# Patient Record
Sex: Female | Born: 1990
Health system: Southern US, Community
[De-identification: ages and names within clinical notes are randomized; demographics above are authoritative.]

## PROBLEM LIST (undated history)

## (undated) DIAGNOSIS — E282 Polycystic ovarian syndrome: Secondary | ICD-10-CM

## (undated) HISTORY — PX: TUBAL LIGATION: SHX77

---

## 2014-07-15 DIAGNOSIS — Z23 Encounter for immunization: Secondary | ICD-10-CM | POA: Insufficient documentation

## 2014-07-15 DIAGNOSIS — E282 Polycystic ovarian syndrome: Secondary | ICD-10-CM | POA: Insufficient documentation

## 2016-01-04 ENCOUNTER — Emergency Department: Payer: Medicaid Other

## 2016-01-04 ENCOUNTER — Emergency Department
Admission: EM | Admit: 2016-01-04 | Discharge: 2016-01-05 | Disposition: A | Discharge status: Home / Self Care | Payer: Medicaid Other | Attending: Emergency Medicine | Admitting: Emergency Medicine

## 2016-01-04 ENCOUNTER — Encounter: Payer: Self-pay | Admitting: *Deleted

## 2016-01-04 DIAGNOSIS — R109 Unspecified abdominal pain: Secondary | ICD-10-CM

## 2016-01-04 DIAGNOSIS — R1030 Lower abdominal pain, unspecified: Secondary | ICD-10-CM | POA: Insufficient documentation

## 2016-01-04 DIAGNOSIS — R110 Nausea: Secondary | ICD-10-CM | POA: Diagnosis not present

## 2016-01-04 DIAGNOSIS — O9989 Other specified diseases and conditions complicating pregnancy, childbirth and the puerperium: Secondary | ICD-10-CM | POA: Diagnosis present

## 2016-01-04 DIAGNOSIS — Z3A01 Less than 8 weeks gestation of pregnancy: Secondary | ICD-10-CM | POA: Diagnosis not present

## 2016-01-04 DIAGNOSIS — R102 Pelvic and perineal pain: Secondary | ICD-10-CM | POA: Diagnosis not present

## 2016-01-04 DIAGNOSIS — O26899 Other specified pregnancy related conditions, unspecified trimester: Secondary | ICD-10-CM

## 2016-01-04 DIAGNOSIS — Z3490 Encounter for supervision of normal pregnancy, unspecified, unspecified trimester: Secondary | ICD-10-CM

## 2016-01-04 DIAGNOSIS — O99211 Obesity complicating pregnancy, first trimester: Secondary | ICD-10-CM | POA: Diagnosis not present

## 2016-01-04 HISTORY — DX: Polycystic ovarian syndrome: E28.2

## 2016-01-04 HISTORY — DX: Morbid (severe) obesity due to excess calories: E66.01

## 2016-01-04 LAB — COMPREHENSIVE METABOLIC PANEL
ALK PHOS: 59 U/L (ref 38–126)
ALT: 11 U/L — AB (ref 14–54)
AST: 20 U/L (ref 15–41)
Albumin: 3.8 g/dL (ref 3.5–5.0)
Anion gap: 6 (ref 5–15)
BUN: 9 mg/dL (ref 6–20)
CHLORIDE: 105 mmol/L (ref 101–111)
CO2: 24 mmol/L (ref 22–32)
CREATININE: 0.64 mg/dL (ref 0.44–1.00)
Calcium: 8.8 mg/dL — ABNORMAL LOW (ref 8.9–10.3)
GFR calc Af Amer: >60 mL/min (ref >60)
GFR calc non Af Amer: >60 mL/min (ref >60)
GLUCOSE: 95 mg/dL (ref 65–99)
Potassium: 3.4 mmol/L — ABNORMAL LOW (ref 3.5–5.1)
SODIUM: 135 mmol/L (ref 135–145)
Total Bilirubin: <0.1 mg/dL — ABNORMAL LOW (ref 0.3–1.2)
Total Protein: 7.2 g/dL (ref 6.5–8.1)

## 2016-01-04 LAB — CBC
HCT: 34.3 % — ABNORMAL LOW (ref 35.0–47.0)
Hemoglobin: 10.6 g/dL — ABNORMAL LOW (ref 12.0–16.0)
MCH: 20.3 pg — AB (ref 26.0–34.0)
MCHC: 31 g/dL — AB (ref 32.0–36.0)
MCV: 65.5 fL — AB (ref 80.0–100.0)
PLATELETS: 236 10*3/uL (ref 150–440)
RBC: 5.23 MIL/uL — ABNORMAL HIGH (ref 3.80–5.20)
RDW: 17.2 % — AB (ref 11.5–14.5)
WBC: 11.2 10*3/uL — ABNORMAL HIGH (ref 3.6–11.0)

## 2016-01-04 LAB — URINALYSIS COMPLETE WITH MICROSCOPIC (ARMC ONLY)
BILIRUBIN URINE: NEGATIVE
Bacteria, UA: NONE SEEN
GLUCOSE, UA: NEGATIVE mg/dL
Hgb urine dipstick: NEGATIVE
Ketones, ur: NEGATIVE mg/dL
Leukocytes, UA: NEGATIVE
Nitrite: NEGATIVE
PH: 5 (ref 5.0–8.0)
Protein, ur: NEGATIVE mg/dL
Specific Gravity, Urine: 1.019 (ref 1.005–1.030)

## 2016-01-04 LAB — HCG, QUANTITATIVE, PREGNANCY: hCG, Beta Chain, Quant, S: 27544 m[IU]/mL — ABNORMAL HIGH (ref <5)

## 2016-01-04 NOTE — ED Provider Notes (Signed)
[facility] Emergency Department Provider Note  ____________________________________________  Time seen: Approximately 11:35 PM  I have reviewed the triage vital signs and the nursing notes.   HISTORY  Chief Complaint Abdominal Pain    HPI Angela Hinton is a 25 y.o. female G2P1 with a history of PCOS and morbid obesity who presentswith a recent diagnosis of pregnancy and complains of sharp pelvic pains.  She states that she did not know she is pregnant and typically has irregular periods.  Her last menstrual period was approximately 2 months ago but that is not unusual for her.  She went to her primary care doctor for her annual visit including Pap smear and a urine pregnancy test revealed that she was pregnant.  She does not know her dates but it is most likely less than 8 weeks given that her last period was about 2 months ago.  She reports that she has been having mild intermittent lower abdominal cramping for about a week but did not think much of it.  However after she learned she was pregnant she became aware of intermittent moderate sharp lower abdominal pains that are brief but concerning to her.  She occasionally has nausea but no vomiting.  She denies dysuria, vaginal bleeding and discharge, fever/chills, chest pain, shortness of breath, and any other abdominal pain.  Nothing makes the sharp pains better and nothing makes it worse.  They are acute in onset  Patient has primary care doctor in [place] located about an hour away but does not have a local provider yet.  Past Medical History  Diagnosis Date  . PCOS (polycystic ovarian syndrome)   . Morbid obesity (HCC)     There are no active problems to display for this patient.   History reviewed. No pertinent past surgical history.  No current outpatient prescriptions on file.  Allergies Morphine and related and Ivp dye  History reviewed. No pertinent family history.  Social  History Social History  Substance Use Topics  . Smoking status: Never Smoker   . Smokeless tobacco: None  . Alcohol Use: No    Review of Systems Constitutional: No fever/chills Eyes: No visual changes. ENT: No sore throat. Cardiovascular: Denies chest pain. Respiratory: Denies shortness of breath. Gastrointestinal: Intermittent lower abdominal sharp stabbing pains that are brief and accompanied with nausea but no vomiting. Genitourinary: Negative for dysuria.  No vaginal bleeding or discharge Musculoskeletal: Negative for back pain. Skin: Negative for rash. Neurological: Negative for headaches, focal weakness or numbness.  10-point ROS otherwise negative.  ____________________________________________   PHYSICAL EXAM:  VITAL SIGNS: ED Triage Vitals  Enc Vitals Group     BP 01/04/16 2036 143/73 mmHg     Pulse Rate 01/04/16 2036 91     Resp 01/04/16 2036 20     Temp 01/04/16 2036 98.8 F (37.1 C)     Temp Source 01/04/16 2036 Oral     SpO2 01/04/16 2036 100 %     Weight 01/04/16 2036 275 lb (124.739 kg)     Height 01/04/16 2036 5\' 2"  (1.575 m)     Head Cir --      Peak Flow --      Pain Score 01/04/16 2039 6     Pain Loc --      Pain Edu? --      Excl. in GC? --     Constitutional: Alert and oriented. Well appearing and in no acute distress. Eyes: Conjunctivae are normal. PERRL. EOMI. Head: Atraumatic.  Nose: No congestion/rhinnorhea. Mouth/Throat: Mucous membranes are moist.  Oropharynx non-erythematous. Neck: No stridor.   Cardiovascular: Normal rate, regular rhythm. Grossly normal heart sounds.  Good peripheral circulation. Respiratory: Normal respiratory effort.  No retractions. Lungs CTAB. Gastrointestinal: Morbid obesity.  Soft and nontender. No distention. No abdominal bruits. No CVA tenderness. Genitourinary: Deferred given no vaginal bleeding and pelvic exam/Pap smear performed 2 days ago by PCP Musculoskeletal: No lower extremity tenderness nor edema.   No joint effusions. Neurologic:  Normal speech and language. No gross focal neurologic deficits are appreciated.  Skin:  Skin is warm, dry and intact. No rash noted. Psychiatric: Mood and affect are normal. Speech and behavior are normal.  ____________________________________________   LABS (all labs ordered are listed, but only abnormal results are displayed)  Labs Reviewed  COMPREHENSIVE METABOLIC PANEL - Abnormal; Notable for the following:    Potassium 3.4 (*)    Calcium 8.8 (*)    ALT 11 (*)    Total Bilirubin <0.1 (*)    All other components within normal limits  CBC - Abnormal; Notable for the following:    WBC 11.2 (*)    RBC 5.23 (*)    Hemoglobin 10.6 (*)    HCT 34.3 (*)    MCV 65.5 (*)    MCH 20.3 (*)    MCHC 31.0 (*)    RDW 17.2 (*)    All other components within normal limits  URINALYSIS COMPLETEWITH MICROSCOPIC ([facility] ONLY) - Abnormal; Notable for the following:    Color, Urine YELLOW (*)    APPearance CLEAR (*)    Squamous Epithelial / LPF 0-5 (*)    All other components within normal limits  HCG, QUANTITATIVE, PREGNANCY - Abnormal; Notable for the following:    hCG, Beta Chain, Quant, S 96045 (*)    All other components within normal limits   ____________________________________________  EKG  None ____________________________________________  RADIOLOGY   US Ob Comp Less 14 Wks  01/05/2016  CLINICAL DATA:  Pregnant patient with pelvic pain in early pregnancy. EXAM: OBSTETRIC <14 WK [place] AND TRANSVAGINAL OB US TECHNIQUE: Both transabdominal and transvaginal ultrasound examinations were performed for complete evaluation of the gestation as well as the maternal uterus, adnexal regions, and pelvic cul-de-sac. Transvaginal technique was performed to assess early pregnancy. COMPARISON:  None. FINDINGS: Intrauterine gestational sac: Visualized/normal in shape. Yolk sac:  Present. Embryo:  Present. Cardiac Activity: Present. Heart Rate: 123  bpm CRL:  5.6  mm   6 w    2 d                  [place] EDC: 08/28/2016 Subchorionic hemorrhage:  None visualized. Maternal uterus/adnexae: Both ovaries are well visualized and normal. No pelvic free fluid. IMPRESSION: Single live intrauterine pregnancy estimated gestational age [redacted] weeks 2 days for estimated date of delivery 08/28/2016. No complication. Electronically Signed   By: Rubye Oaks M.D.   On: 01/05/2016 01:09   US Ob Transvaginal  01/05/2016  CLINICAL DATA:  Pregnant patient with pelvic pain in early pregnancy. EXAM: OBSTETRIC <14 WK [place] AND TRANSVAGINAL OB US TECHNIQUE: Both transabdominal and transvaginal ultrasound examinations were performed for complete evaluation of the gestation as well as the maternal uterus, adnexal regions, and pelvic cul-de-sac. Transvaginal technique was performed to assess early pregnancy. COMPARISON:  None. FINDINGS: Intrauterine gestational sac: Visualized/normal in shape. Yolk sac:  Present. Embryo:  Present. Cardiac Activity: Present. Heart Rate: 123  bpm CRL:  5.6  mm   6 w  2 d                  [place] EDC: 08/28/2016 Subchorionic hemorrhage:  None visualized. Maternal uterus/adnexae: Both ovaries are well visualized and normal. No pelvic free fluid. IMPRESSION: Single live intrauterine pregnancy estimated gestational age [redacted] weeks 2 days for estimated date of delivery 08/28/2016. No complication. Electronically Signed   By: Rubye Oaks M.D.   On: 01/05/2016 01:09    ____________________________________________   PROCEDURES  Procedure(s) performed: None  Critical Care performed: No ____________________________________________   INITIAL IMPRESSION / ASSESSMENT AND PLAN / ED COURSE  Pertinent labs & imaging results that were available during my care of the patient were reviewed by me and considered in my medical decision making (see chart for details).  The patient's labs including urinalysis are unremarkable and her vital signs are normal and stable.  I suspect that she is more  aware of normal early pregnancy discomfort now that she knows she is pregnant and that she does not have an emergent medical condition at this time.  However her body habitus would make it difficult to adequately assess the pregnancy with a bedside ultrasound.  I will obtain a early pregnancy transvaginal ultrasound to assess the pregnancy and ensure and intrauterine pregnancy and attempt to assess dates.  The patient agrees with the plan at this time.  ----------------------------------------- 1:39 AM on 01/05/2016 -----------------------------------------  Reassuring ultrasound workup.  Patient sleeping comfortably with stable vitals.  I gave the results of the ultrasound.  I gave my usual and customary return precautions.  No indication of emergent medical condition at this time.  ____________________________________________  FINAL CLINICAL IMPRESSION(S) / ED DIAGNOSES  Final diagnoses:  Early stage of pregnancy  Abdominal pain during pregnancy      NEW MEDICATIONS STARTED DURING THIS VISIT:  New Prescriptions   No medications on file     Loleta Rose, MD 01/05/16 0139

## 2016-01-04 NOTE — ED Notes (Signed)
Pt has low abd pain.  No vag bleeding.  No dysuria.   Pt has lower back pain.  Pt is pregnant.

## 2016-01-05 NOTE — Discharge Instructions (Signed)
You were seen today for abdominal/pelvic pain and the early stages of pregnancy.  Your workup was reassuring today and your ultrasound showed a single pregnancy at 6 weeks and 2 days gestation.  Please continue taking your prenatal vitamins and follow up with your prenatal provider.  Return to the emergency department with new or worsening symptoms that concern you.   First Trimester of Pregnancy The first trimester of pregnancy is from week 1 until the end of week 12 (months 1 through 3). A week after a sperm fertilizes an egg, the egg will implant on the wall of the uterus. This embryo will begin to develop into a baby. Genes from you and your partner are forming the baby. The female genes determine whether the baby is a boy or a girl. At 6-8 weeks, the eyes and face are formed, and the heartbeat can be seen on ultrasound. At the end of 12 weeks, all the baby's organs are formed.  Now that you are pregnant, you will want to do everything you can to have a healthy baby. Two of the most important things are to get good prenatal care and to follow your health care provider's instructions. Prenatal care is all the medical care you receive before the baby's birth. This care will help prevent, find, and treat any problems during the pregnancy and childbirth. BODY CHANGES Your body goes through many changes during pregnancy. The changes vary from woman to woman.   You may gain or lose a couple of pounds at first.  You may feel sick to your stomach (nauseous) and throw up (vomit). If the vomiting is uncontrollable, call your health care provider.  You may tire easily.  You may develop headaches that can be relieved by medicines approved by your health care provider.  You may urinate more often. Painful urination may mean you have a bladder infection.  You may develop heartburn as a result of your pregnancy.  You may develop constipation because certain hormones are causing the muscles that push waste  through your intestines to slow down.  You may develop hemorrhoids or swollen, bulging veins (varicose veins).  Your breasts may begin to grow larger and become tender. Your nipples may stick out more, and the tissue that surrounds them (areola) may become darker.  Your gums may bleed and may be sensitive to brushing and flossing.  Dark spots or blotches (chloasma, mask of pregnancy) may develop on your face. This will likely fade after the baby is born.  Your menstrual periods will stop.  You may have a loss of appetite.  You may develop cravings for certain kinds of food.  You may have changes in your emotions from day to day, such as being excited to be pregnant or being concerned that something may go wrong with the pregnancy and baby.  You may have more vivid and strange dreams.  You may have changes in your hair. These can include thickening of your hair, rapid growth, and changes in texture. Some women also have hair loss during or after pregnancy, or hair that feels dry or thin. Your hair will most likely return to normal after your baby is born. WHAT TO EXPECT AT YOUR PRENATAL VISITS During a routine prenatal visit:  You will be weighed to make sure you and the baby are growing normally.  Your blood pressure will be taken.  Your abdomen will be measured to track your baby's growth.  The fetal heartbeat will be listened to starting around  week 10 or 12 of your pregnancy.  Test results from any previous visits will be discussed. Your health care provider may ask you:  How you are feeling.  If you are feeling the baby move.  If you have had any abnormal symptoms, such as leaking fluid, bleeding, severe headaches, or abdominal cramping.  If you are using any tobacco products, including cigarettes, chewing tobacco, and electronic cigarettes.  If you have any questions. Other tests that may be performed during your first trimester include:  Blood tests to find your  blood type and to check for the presence of any previous infections. They will also be used to check for low iron levels (anemia) and Rh antibodies. Later in the pregnancy, blood tests for diabetes will be done along with other tests if problems develop.  Urine tests to check for infections, diabetes, or protein in the urine.  An ultrasound to confirm the proper growth and development of the baby.  An amniocentesis to check for possible genetic problems.  Fetal screens for spina bifida and Down syndrome.  You may need other tests to make sure you and the baby are doing well.  HIV (human immunodeficiency virus) testing. Routine prenatal testing includes screening for HIV, unless you choose not to have this test. HOME CARE INSTRUCTIONS  Medicines  Follow your health care provider's instructions regarding medicine use. Specific medicines may be either safe or unsafe to take during pregnancy.  Take your prenatal vitamins as directed.  If you develop constipation, try taking a stool softener if your health care provider approves. Diet  Eat regular, well-balanced meals. Choose a variety of foods, such as meat or vegetable-based protein, fish, milk and low-fat dairy products, vegetables, fruits, and whole grain breads and cereals. Your health care provider will help you determine the amount of weight gain that is right for you.  Avoid raw meat and uncooked cheese. These carry germs that can cause birth defects in the baby.  Eating four or five small meals rather than three large meals a day may help relieve nausea and vomiting. If you start to feel nauseous, eating a few soda crackers can be helpful. Drinking liquids between meals instead of during meals also seems to help nausea and vomiting.  If you develop constipation, eat more high-fiber foods, such as fresh vegetables or fruit and whole grains. Drink enough fluids to keep your urine clear or pale yellow. Activity and Exercise  Exercise  only as directed by your health care provider. Exercising will help you:  Control your weight.  Stay in shape.  Be prepared for labor and delivery.  Experiencing pain or cramping in the lower abdomen or low back is a good sign that you should stop exercising. Check with your health care provider before continuing normal exercises.  Try to avoid standing for long periods of time. Move your legs often if you must stand in one place for a long time.  Avoid heavy lifting.  Wear low-heeled shoes, and practice good posture.  You may continue to have sex unless your health care provider directs you otherwise. Relief of Pain or Discomfort  Wear a good support bra for breast tenderness.   Take warm sitz baths to soothe any pain or discomfort caused by hemorrhoids. Use hemorrhoid cream if your health care provider approves.   Rest with your legs elevated if you have leg cramps or low back pain.  If you develop varicose veins in your legs, wear support hose. Elevate your feet  for 15 minutes, 3-4 times a day. Limit salt in your diet. Prenatal Care  Schedule your prenatal visits by the twelfth week of pregnancy. They are usually scheduled monthly at first, then more often in the last 2 months before delivery.  Write down your questions. Take them to your prenatal visits.  Keep all your prenatal visits as directed by your health care provider. Safety  Wear your seat belt at all times when driving.  Make a list of emergency phone numbers, including numbers for family, friends, the hospital, and police and fire departments. General Tips  Ask your health care provider for a referral to a local prenatal education class. Begin classes no later than at the beginning of month 6 of your pregnancy.  Ask for help if you have counseling or nutritional needs during pregnancy. Your health care provider can offer advice or refer you to specialists for help with various needs.  Do not use hot tubs,  steam rooms, or saunas.  Do not douche or use tampons or scented sanitary pads.  Do not cross your legs for long periods of time.  Avoid cat litter boxes and soil used by cats. These carry germs that can cause birth defects in the baby and possibly loss of the fetus by miscarriage or stillbirth.  Avoid all smoking, herbs, alcohol, and medicines not prescribed by your health care provider. Chemicals in these affect the formation and growth of the baby.  Do not use any tobacco products, including cigarettes, chewing tobacco, and electronic cigarettes. If you need help quitting, ask your health care provider. You may receive counseling support and other resources to help you quit.  Schedule a dentist appointment. At home, brush your teeth with a soft toothbrush and be gentle when you floss. SEEK MEDICAL CARE IF:   You have dizziness.  You have mild pelvic cramps, pelvic pressure, or nagging pain in the abdominal area.  You have persistent nausea, vomiting, or diarrhea.  You have a bad smelling vaginal discharge.  You have pain with urination.  You notice increased swelling in your face, hands, legs, or ankles. SEEK IMMEDIATE MEDICAL CARE IF:   You have a fever.  You are leaking fluid from your vagina.  You have spotting or bleeding from your vagina.  You have severe abdominal cramping or pain.  You have rapid weight gain or loss.  You vomit blood or material that looks like coffee grounds.  You are exposed to [place] measles and have never had them.  You are exposed to fifth disease or chickenpox.  You develop a severe headache.  You have shortness of breath.  You have any kind of trauma, such as from a fall or a car accident.   This information is not intended to replace advice given to you by your health care provider. Make sure you discuss any questions you have with your health care provider.   Document Released: 11/12/2001 Document Revised: 12/09/2014 Document  Reviewed: 09/28/2013 Elsevier Interactive Patient Education 2016 [employer].  Prenatal Care WHAT IS PRENATAL CARE?  Prenatal care is the process of caring for a pregnant woman before she gives birth. Prenatal care makes sure that she and her baby remain as healthy as possible throughout pregnancy. Prenatal care may be provided by a midwife, family practice health care provider, or a childbirth and pregnancy specialist (obstetrician). Prenatal care may include physical examinations, testing, treatments, and education on nutrition, lifestyle, and social support services. WHY IS PRENATAL CARE SO IMPORTANT?  Early  and consistent prenatal care increases the chance that you and your baby will remain healthy throughout your pregnancy. This type of care also decreases a baby's risk of being born too early (prematurely), or being born smaller than expected (small for gestational age). Any underlying medical conditions you may have that could pose a risk during your pregnancy are discussed during prenatal care visits. You will also be monitored regularly for any new conditions that may arise during your pregnancy so they can be treated quickly and effectively. WHAT HAPPENS DURING PRENATAL CARE VISITS? Prenatal care visits may include the following: Discussion Tell your health care provider about any new signs or symptoms you have experienced since your last visit. These might include:  Nausea or vomiting.  Increased or decreased level of energy.  Difficulty sleeping.  Back or leg pain.  Weight changes.  Frequent urination.  Shortness of breath with physical activity.  Changes in your skin, such as the development of a rash or itchiness.  Vaginal discharge or bleeding.  Feelings of excitement or nervousness.  Changes in your baby's movements. You may want to write down any questions or topics you want to discuss with your health care provider and bring them with you to your  appointment. Examination During your first prenatal care visit, you will likely have a complete physical exam. Your health care provider will often examine your vagina, cervix, and the position of your uterus, as well as check your heart, lungs, and other body systems. As your pregnancy progresses, your health care provider will measure the size of your uterus and your baby's position inside your uterus. He or she may also examine you for early signs of labor. Your prenatal visits may also include checking your blood pressure and, after about 10-12 weeks of pregnancy, listening to your baby's heartbeat. Testing Regular testing often includes:  Urinalysis. This checks your urine for glucose, protein, or signs of infection.  Blood count. This checks the levels of white and red blood cells in your body.  Tests for sexually transmitted infections (STIs). Testing for STIs at the beginning of pregnancy is routinely done and is required in many states.  Antibody testing. You will be checked to see if you are immune to certain illnesses, such as rubella, that can affect a developing fetus.  Glucose screen. Around 24-28 weeks of pregnancy, your blood glucose level will be checked for signs of gestational diabetes. Follow-up tests may be recommended.  Group B strep. This is a bacteria that is commonly found inside a woman's vagina. This test will inform your health care provider if you need an antibiotic to reduce the amount of this bacteria in your body prior to labor and childbirth.  Ultrasound. Many pregnant women undergo an ultrasound screening around 18-20 weeks of pregnancy to evaluate the health of the fetus and check for any developmental abnormalities.  HIV (human immunodeficiency virus) testing. Early in your pregnancy, you will be screened for HIV. If you are at high risk for HIV, this test may be repeated during your third trimester of pregnancy. You may be offered other testing based on your  age, personal or family medical history, or other factors.  HOW OFTEN SHOULD I PLAN TO SEE MY HEALTH CARE PROVIDER FOR PRENATAL CARE? Your prenatal care check-up schedule depends on any medical conditions you have before, or develop during, your pregnancy. If you do not have any underlying medical conditions, you will likely be seen for checkups:  Monthly, during the first 6  months of pregnancy.  Twice a month during months 7 and 8 of pregnancy.  Weekly starting in the 9th month of pregnancy and until delivery. If you develop signs of early labor or other concerning signs or symptoms, you may need to see your health care provider more often. Ask your health care provider what prenatal care schedule is best for you. WHAT CAN I DO TO KEEP MYSELF AND MY BABY AS HEALTHY AS POSSIBLE DURING MY PREGNANCY?  Take a prenatal vitamin containing 400 micrograms (0.4 mg) of folic acid every day. Your health care provider may also ask you to take additional vitamins such as iodine, vitamin D, iron, copper, and zinc.  Take 1500-2000 mg of calcium daily starting at your 20th week of pregnancy until you deliver your baby.  Make sure you are up to date on your vaccinations. Unless directed otherwise by your health care provider:  You should receive a tetanus, diphtheria, and pertussis (Tdap) vaccination between the 27th and 36th week of your pregnancy, regardless of when your last Tdap immunization occurred. This helps protect your baby from whooping cough (pertussis) after he or she is born.  You should receive an annual inactivated influenza vaccine (IIV) to help protect you and your baby from influenza. This can be done at any point during your pregnancy.  Eat a well-rounded diet that includes:  Fresh fruits and vegetables.  Lean proteins.  Calcium-rich foods such as milk, yogurt, hard cheeses, and dark, leafy greens.  Whole grain breads.  Do noteat seafood high in mercury,  including:  Swordfish.  Tilefish.  Shark.  King mackerel.  More than 6 oz tuna per week.  Do not eat:  Raw or undercooked meats or eggs.  Unpasteurized foods, such as soft cheeses (brie, blue, or feta), juices, and milks.  Lunch meats.  Hot dogs that have not been heated until they are steaming.  Drink enough water to keep your urine clear or pale yellow. For many women, this may be 10 or more 8 oz glasses of water each day. Keeping yourself hydrated helps deliver nutrients to your baby and may prevent the start of pre-term uterine contractions.  Do not use any tobacco products including cigarettes, chewing tobacco, or electronic cigarettes. If you need help quitting, ask your health care provider.  Do not drink beverages containing alcohol. No safe level of alcohol consumption during pregnancy has been determined.  Do not use any illegal drugs. These can harm your developing baby or cause a miscarriage.  Ask your health care provider or pharmacist before taking any prescription or over-the-counter medicines, herbs, or supplements.  Limit your caffeine intake to no more than 200 mg per day.  Exercise. Unless told otherwise by your health care provider, try to get 30 minutes of moderate exercise most days of the week. Do not  do high-impact activities, contact sports, or activities with a high risk of falling, such as horseback riding or downhill skiing.  Get plenty of rest.  Avoid anything that raises your body temperature, such as hot tubs and saunas.  If you own a cat, do not empty its litter box. Bacteria contained in cat feces can cause an infection called toxoplasmosis. This can result in serious harm to the fetus.  Stay away from chemicals such as insecticides, lead, mercury, and cleaning or paint products that contain solvents.  Do not have any X-rays taken unless medically necessary.  Take a childbirth and breastfeeding preparation class. Ask your health care  provider if  you need a referral or recommendation.   This information is not intended to replace advice given to you by your health care provider. Make sure you discuss any questions you have with your health care provider.   Document Released: 11/21/2003 Document Revised: 12/09/2014 Document Reviewed: 02/02/2014 Elsevier Interactive Patient Education [employer]

## 2016-01-25 DIAGNOSIS — Z8759 Personal history of other complications of pregnancy, childbirth and the puerperium: Secondary | ICD-10-CM

## 2016-01-25 DIAGNOSIS — O99210 Obesity complicating pregnancy, unspecified trimester: Secondary | ICD-10-CM | POA: Insufficient documentation

## 2016-01-25 DIAGNOSIS — Z98891 History of uterine scar from previous surgery: Secondary | ICD-10-CM | POA: Insufficient documentation

## 2016-01-25 HISTORY — DX: Personal history of other complications of pregnancy, childbirth and the puerperium: Z87.59

## 2016-04-18 ENCOUNTER — Ambulatory Visit
Admission: EM | Admit: 2016-04-18 | Discharge: 2016-04-18 | Discharge status: Absent without leave | Payer: Medicaid Other

## 2016-04-18 ENCOUNTER — Encounter: Payer: Self-pay | Admitting: Emergency Medicine

## 2016-04-18 ENCOUNTER — Ambulatory Visit
Admission: EM | Admit: 2016-04-18 | Discharge: 2016-04-18 | Disposition: A | Discharge status: Home / Self Care | Payer: BLUE CROSS/BLUE SHIELD | Attending: Emergency Medicine | Admitting: Emergency Medicine

## 2016-04-18 DIAGNOSIS — R109 Unspecified abdominal pain: Secondary | ICD-10-CM | POA: Diagnosis not present

## 2016-04-18 DIAGNOSIS — O9989 Other specified diseases and conditions complicating pregnancy, childbirth and the puerperium: Secondary | ICD-10-CM

## 2016-04-18 DIAGNOSIS — O26899 Other specified pregnancy related conditions, unspecified trimester: Secondary | ICD-10-CM

## 2016-04-18 LAB — URINALYSIS COMPLETE WITH MICROSCOPIC (ARMC ONLY)
Bacteria, UA: NONE SEEN
Bilirubin Urine: NEGATIVE
Glucose, UA: NEGATIVE mg/dL
Hgb urine dipstick: NEGATIVE
Ketones, ur: NEGATIVE mg/dL
LEUKOCYTES UA: NEGATIVE
Nitrite: NEGATIVE
PH: 7 (ref 5.0–8.0)
PROTEIN: NEGATIVE mg/dL
SPECIFIC GRAVITY, URINE: 1.015 (ref 1.005–1.030)

## 2016-04-18 NOTE — ED Notes (Signed)
Patient states she was injured at work in April. Was carrying boxes and felt a pop in her abdomin, patient went to the ED then but is still having pain.

## 2016-04-18 NOTE — ED Provider Notes (Signed)
HPI  SUBJECTIVE:  Angela Hinton is a 25 y.o. female who is currently 21 weeks 1 day pregnant who presents with persistent lower abdominal pain that started one month ago after lifting heavy boxes at work. She states that she felt a "tear/pop" followed by sharp pain inferior to her umbilicus. States is occasionally radiates to her back. It is present only with bending and lifting. It is not present with sneezing, coughing, urination, defecation or any other time. She has not noticed any abdominal swelling or deformity. No nausea, vomiting, fevers, urinary complaints, vaginal bleeding, vaginal discharge. She has no regular contraction-like pelvic pain. No leakage of fluid, passage of tissue. She states that she is feeling the baby move. Patient states that she has not had any change in her pain today, but is looking for second opinion and is looking for guidance in sorting out her Worker's Compensation claim. She was seen in the [facility] ER for this, no workup was performed. She was referred to her OB/GYN, who has placed the patient on lifting restrictions. Patient states that she's been out of work for the past month. Past medical history of C-section, history of preeclampsia. No history of diabetes, hypertension, other abdominal surgeries, hernia, obstruction. PND: [facility]N at [facility] that we've for crossing. Her next appointment is on 6/16.  Patient was seen in the [facility] ER on 2/3 for lower pelvic pain. She had an ultrasound confirming an IUP.   Past Medical History  Diagnosis Date  . PCOS (polycystic ovarian syndrome)   . Morbid obesity [facility])     Past Surgical History  Procedure Laterality Date  . Cesarean section      Family History  Problem Relation Age of Onset  . Hypertension Father     Social History  Substance Use Topics  . Smoking status: Never Smoker   . Smokeless tobacco: None  . Alcohol Use: No    No current facility-administered medications for this encounter.  Current  outpatient prescriptions:  .  aspirin EC 81 MG tablet, Take 81 mg by mouth daily., Disp: , Rfl:  .  Prenatal Vit-Fe Fumarate-FA (MULTIVITAMIN-PRENATAL) 27-0.8 MG TABS tablet, Take 1 tablet by mouth daily at 12 noon., Disp: , Rfl:  .  pyridOXINE (VITAMIN B-6) 100 MG tablet, Take 100 mg by mouth daily., Disp: , Rfl:   Allergies  Allergen Reactions  . Morphine And Related Hives  . Ivp Dye [Iodinated Diagnostic Agents] Rash     ROS  As noted in HPI.   Physical Exam  BP 117/74 mmHg  Pulse 98  Temp(Src) 98.7 F (37.1 C) (Oral)  Resp 18  Ht  (1.575 m)  Wt 240 lb (108.863 kg)  BMI 43.89 kg/m2  SpO2 100%  LMP 11/24/2015 (Approximate)  Constitutional: Well developed, well nourished, no acute distress Eyes:  EOMI, conjunctiva normal bilaterally HENT: Normocephalic, atraumatic,mucus membranes moist Respiratory: Normal inspiratory effort Cardiovascular: Normal rate GI: nondistended. Tenderness inferior to the umbilicus, the patient states has been present for the past month. States it is unchanged today.. No appreciable hernia with Valsalva. Gravid uterus, size is appropriate with dates. Uterus otherwise nontender. There is no other abdominal tenderness. GU: Deferred  Back: No CVA tenderness. skin: No rash, skin intact Musculoskeletal: no deformities Neurologic: Alert & oriented x 3, no focal neuro deficits Psychiatric: Speech and behavior appropriate   ED Course   Medications - No data to display  Orders Placed This Encounter  Procedures  . Urinalysis complete, with microscopic  Standing Status: Standing     Number of Occurrences: 1     Standing Expiration Date:   . Assess fetal heart tones    Standing Status: Standing     Number of Occurrences: 1     Standing Expiration Date:     Results for orders placed or performed during the hospital encounter of 04/18/16 (from the past 24 hour(s))  Urinalysis complete, with microscopic     Status: Abnormal   Collection  Time: 04/18/16  6:01 PM  Result Value Ref Range   Color, Urine YELLOW YELLOW   APPearance CLEAR CLEAR   Glucose, UA NEGATIVE NEGATIVE mg/dL   Bilirubin Urine NEGATIVE NEGATIVE   Ketones, ur NEGATIVE NEGATIVE mg/dL   Specific Gravity, Urine 1.015 1.005 - 1.030   Hgb urine dipstick NEGATIVE NEGATIVE   pH 7.0 5.0 - 8.0   Protein, ur NEGATIVE NEGATIVE mg/dL   Nitrite NEGATIVE NEGATIVE   Leukocytes, UA NEGATIVE NEGATIVE   RBC / HPF 0-5 0 - 5 RBC/hpf   WBC, UA 0-5 0 - 5 WBC/hpf   Bacteria, UA NONE SEEN NONE SEEN   Squamous Epithelial / LPF 0-5 (A) NONE SEEN   No results found.  ED Clinical Impression  Abdominal pain during pregnancy  ED Assessment/Plan  Previous records reviewed. As noted in history of present illness.  Pelvic deferred given absence of vaginal complaints. Will check fetal heart tones, UA. We'll refer to occupational health in assistance in sorting out this claim.  FHT 170   UA reviewed. No UTI. No bacteriuria.  Doubt uterine dehiscence or uterine abruption as patient is comfortable, has mild abdominal/uterine tenderness which she states is not different today. She has no evidence of surgical abdomen. No peritoneal signs. Ordered follow-up for occupational health. Discussed labs, medical decision-making, plan for follow-up, signs and symptoms that should prompt return to the emergency room. Patient agrees with plan   *This clinic note was created using Dragon dictation software. Therefore, there may be occasional mistakes despite careful proofreading.  ?   Domenick GongAshley Linnette Panella, MD 04/18/16 [phone]

## 2016-04-18 NOTE — Discharge Instructions (Signed)
Follow up with Angela Hinton at occupational health.Also follow-up with her OB/GYN. Urine was negative for UTI today, so no further treatment is needed for this. Your fetal heart tones are within normal limits, so the baby is doing well. Go to the ER for the signs and symptoms we discussed.

## 2016-06-13 DIAGNOSIS — Z8759 Personal history of other complications of pregnancy, childbirth and the puerperium: Secondary | ICD-10-CM | POA: Diagnosis not present

## 2016-06-14 DIAGNOSIS — O0992 Supervision of high risk pregnancy, unspecified, second trimester: Secondary | ICD-10-CM | POA: Diagnosis not present

## 2016-06-14 DIAGNOSIS — O0993 Supervision of high risk pregnancy, unspecified, third trimester: Secondary | ICD-10-CM | POA: Diagnosis not present

## 2016-06-14 DIAGNOSIS — O99210 Obesity complicating pregnancy, unspecified trimester: Secondary | ICD-10-CM | POA: Diagnosis not present

## 2016-06-14 DIAGNOSIS — Z23 Encounter for immunization: Secondary | ICD-10-CM | POA: Diagnosis not present

## 2016-06-14 DIAGNOSIS — Z3493 Encounter for supervision of normal pregnancy, unspecified, third trimester: Secondary | ICD-10-CM | POA: Diagnosis not present

## 2016-06-17 DIAGNOSIS — R51 Headache: Secondary | ICD-10-CM | POA: Diagnosis not present

## 2016-06-17 DIAGNOSIS — H538 Other visual disturbances: Secondary | ICD-10-CM | POA: Diagnosis not present

## 2016-06-17 DIAGNOSIS — R030 Elevated blood-pressure reading, without diagnosis of hypertension: Secondary | ICD-10-CM | POA: Diagnosis not present

## 2016-06-17 DIAGNOSIS — O99283 Endocrine, nutritional and metabolic diseases complicating pregnancy, third trimester: Secondary | ICD-10-CM | POA: Diagnosis not present

## 2016-06-17 DIAGNOSIS — O9989 Other specified diseases and conditions complicating pregnancy, childbirth and the puerperium: Secondary | ICD-10-CM | POA: Diagnosis not present

## 2016-06-17 DIAGNOSIS — O26893 Other specified pregnancy related conditions, third trimester: Secondary | ICD-10-CM | POA: Diagnosis not present

## 2016-06-17 DIAGNOSIS — E282 Polycystic ovarian syndrome: Secondary | ICD-10-CM | POA: Diagnosis not present

## 2016-06-17 DIAGNOSIS — O99213 Obesity complicating pregnancy, third trimester: Secondary | ICD-10-CM | POA: Diagnosis not present

## 2016-06-17 DIAGNOSIS — Z3A29 29 weeks gestation of pregnancy: Secondary | ICD-10-CM | POA: Diagnosis not present

## 2016-06-18 DIAGNOSIS — R030 Elevated blood-pressure reading, without diagnosis of hypertension: Secondary | ICD-10-CM | POA: Diagnosis not present

## 2016-06-18 DIAGNOSIS — O9989 Other specified diseases and conditions complicating pregnancy, childbirth and the puerperium: Secondary | ICD-10-CM | POA: Diagnosis not present

## 2016-06-18 DIAGNOSIS — E282 Polycystic ovarian syndrome: Secondary | ICD-10-CM | POA: Diagnosis not present

## 2016-06-18 DIAGNOSIS — Z3A29 29 weeks gestation of pregnancy: Secondary | ICD-10-CM | POA: Diagnosis not present

## 2016-06-18 DIAGNOSIS — O99213 Obesity complicating pregnancy, third trimester: Secondary | ICD-10-CM | POA: Diagnosis not present

## 2016-06-18 DIAGNOSIS — O26893 Other specified pregnancy related conditions, third trimester: Secondary | ICD-10-CM | POA: Diagnosis not present

## 2016-06-18 DIAGNOSIS — H538 Other visual disturbances: Secondary | ICD-10-CM | POA: Diagnosis not present

## 2016-06-18 DIAGNOSIS — R51 Headache: Secondary | ICD-10-CM | POA: Diagnosis not present

## 2016-06-18 DIAGNOSIS — O99283 Endocrine, nutritional and metabolic diseases complicating pregnancy, third trimester: Secondary | ICD-10-CM | POA: Diagnosis not present

## 2016-06-20 DIAGNOSIS — Z8759 Personal history of other complications of pregnancy, childbirth and the puerperium: Secondary | ICD-10-CM | POA: Diagnosis not present

## 2016-06-20 DIAGNOSIS — O99213 Obesity complicating pregnancy, third trimester: Secondary | ICD-10-CM | POA: Diagnosis not present

## 2016-06-20 DIAGNOSIS — O0993 Supervision of high risk pregnancy, unspecified, third trimester: Secondary | ICD-10-CM | POA: Diagnosis not present

## 2016-07-01 DIAGNOSIS — Z36 Encounter for antenatal screening of mother: Secondary | ICD-10-CM | POA: Diagnosis not present

## 2016-07-01 DIAGNOSIS — O1493 Unspecified pre-eclampsia, third trimester: Secondary | ICD-10-CM | POA: Diagnosis not present

## 2016-07-01 DIAGNOSIS — Z3A31 31 weeks gestation of pregnancy: Secondary | ICD-10-CM | POA: Diagnosis not present

## 2016-07-01 DIAGNOSIS — O34211 Maternal care for low transverse scar from previous cesarean delivery: Secondary | ICD-10-CM | POA: Diagnosis not present

## 2016-07-01 DIAGNOSIS — O134 Gestational [pregnancy-induced] hypertension without significant proteinuria, complicating childbirth: Secondary | ICD-10-CM | POA: Diagnosis not present

## 2016-07-01 DIAGNOSIS — Z302 Encounter for sterilization: Secondary | ICD-10-CM | POA: Diagnosis not present

## 2016-07-01 DIAGNOSIS — Z6841 Body Mass Index (BMI) 40.0 and over, adult: Secondary | ICD-10-CM | POA: Diagnosis not present

## 2016-07-01 DIAGNOSIS — O34219 Maternal care for unspecified type scar from previous cesarean delivery: Secondary | ICD-10-CM | POA: Diagnosis not present

## 2016-07-01 DIAGNOSIS — Z3483 Encounter for supervision of other normal pregnancy, third trimester: Secondary | ICD-10-CM | POA: Diagnosis not present

## 2016-07-01 DIAGNOSIS — Z8759 Personal history of other complications of pregnancy, childbirth and the puerperium: Secondary | ICD-10-CM | POA: Diagnosis not present

## 2016-07-01 DIAGNOSIS — Z3A32 32 weeks gestation of pregnancy: Secondary | ICD-10-CM | POA: Diagnosis not present

## 2016-07-01 DIAGNOSIS — O133 Gestational [pregnancy-induced] hypertension without significant proteinuria, third trimester: Secondary | ICD-10-CM | POA: Diagnosis not present

## 2016-07-01 DIAGNOSIS — Z3A34 34 weeks gestation of pregnancy: Secondary | ICD-10-CM | POA: Diagnosis not present

## 2016-07-01 DIAGNOSIS — O1414 Severe pre-eclampsia complicating childbirth: Secondary | ICD-10-CM | POA: Diagnosis not present

## 2016-07-01 DIAGNOSIS — Z3A33 33 weeks gestation of pregnancy: Secondary | ICD-10-CM | POA: Diagnosis not present

## 2016-07-01 DIAGNOSIS — O09219 Supervision of pregnancy with history of pre-term labor, unspecified trimester: Secondary | ICD-10-CM | POA: Diagnosis not present

## 2016-07-01 DIAGNOSIS — O43813 Placental infarction, third trimester: Secondary | ICD-10-CM | POA: Diagnosis not present

## 2016-07-01 DIAGNOSIS — Z885 Allergy status to narcotic agent status: Secondary | ICD-10-CM | POA: Diagnosis not present

## 2016-07-01 DIAGNOSIS — O99213 Obesity complicating pregnancy, third trimester: Secondary | ICD-10-CM | POA: Diagnosis not present

## 2016-07-01 DIAGNOSIS — O99214 Obesity complicating childbirth: Secondary | ICD-10-CM | POA: Diagnosis not present

## 2016-07-01 DIAGNOSIS — E669 Obesity, unspecified: Secondary | ICD-10-CM | POA: Diagnosis not present

## 2016-07-19 DIAGNOSIS — Z6841 Body Mass Index (BMI) 40.0 and over, adult: Secondary | ICD-10-CM | POA: Diagnosis not present

## 2016-07-19 DIAGNOSIS — O99214 Obesity complicating childbirth: Secondary | ICD-10-CM | POA: Diagnosis not present

## 2016-07-19 DIAGNOSIS — Z3A31 31 weeks gestation of pregnancy: Secondary | ICD-10-CM | POA: Diagnosis not present

## 2016-07-19 DIAGNOSIS — O1414 Severe pre-eclampsia complicating childbirth: Secondary | ICD-10-CM | POA: Diagnosis not present

## 2016-07-19 DIAGNOSIS — O34219 Maternal care for unspecified type scar from previous cesarean delivery: Secondary | ICD-10-CM | POA: Diagnosis not present

## 2016-07-19 DIAGNOSIS — Z302 Encounter for sterilization: Secondary | ICD-10-CM | POA: Diagnosis not present

## 2016-07-19 DIAGNOSIS — Z885 Allergy status to narcotic agent status: Secondary | ICD-10-CM | POA: Diagnosis not present

## 2016-07-20 DIAGNOSIS — Z9851 Tubal ligation status: Secondary | ICD-10-CM

## 2016-07-20 HISTORY — DX: Tubal ligation status: Z98.51

## 2016-07-29 DIAGNOSIS — Z0130 Encounter for examination of blood pressure without abnormal findings: Secondary | ICD-10-CM | POA: Diagnosis not present

## 2016-09-06 DIAGNOSIS — Z3483 Encounter for supervision of other normal pregnancy, third trimester: Secondary | ICD-10-CM | POA: Diagnosis not present

## 2016-09-06 DIAGNOSIS — Z3482 Encounter for supervision of other normal pregnancy, second trimester: Secondary | ICD-10-CM | POA: Diagnosis not present

## 2016-10-03 DIAGNOSIS — Z3482 Encounter for supervision of other normal pregnancy, second trimester: Secondary | ICD-10-CM | POA: Diagnosis not present

## 2016-10-03 DIAGNOSIS — Z3483 Encounter for supervision of other normal pregnancy, third trimester: Secondary | ICD-10-CM | POA: Diagnosis not present

## 2017-01-14 ENCOUNTER — Encounter: Payer: Self-pay | Admitting: Emergency Medicine

## 2017-01-14 ENCOUNTER — Emergency Department (INDEPENDENT_AMBULATORY_CARE_PROVIDER_SITE_OTHER)
Admission: EM | Admit: 2017-01-14 | Discharge: 2017-01-14 | Disposition: A | Discharge status: Home / Self Care | Payer: 59 | Source: Home / Self Care | Attending: Family Medicine | Admitting: Family Medicine

## 2017-01-14 DIAGNOSIS — R109 Unspecified abdominal pain: Secondary | ICD-10-CM

## 2017-01-14 LAB — POCT URINALYSIS DIP (MANUAL ENTRY)
BILIRUBIN UA: NEGATIVE
BILIRUBIN UA: NEGATIVE
Blood, UA: NEGATIVE
Glucose, UA: NEGATIVE
LEUKOCYTES UA: NEGATIVE
NITRITE UA: NEGATIVE
Protein Ur, POC: NEGATIVE
Spec Grav, UA: 1.025 (ref 1.005–1.03)
Urobilinogen, UA: NEGATIVE (ref 0–1)
pH, UA: 6 (ref 5–8)

## 2017-01-14 LAB — POCT URINE PREGNANCY: Preg Test, Ur: NEGATIVE

## 2017-01-14 NOTE — ED Triage Notes (Signed)
Pt c/o right sided back pain. States she is 2 weeks late for her period. Two faint positive UPT at home. She had tubal ligation done 07/2016. She has hx of pcos. She is not breastfeeding or taking OCP's.

## 2017-01-14 NOTE — Discharge Instructions (Signed)
May take Ibuprofen 200mg, 4 tabs every 8 hours with food.  °Call if rash develops °If symptoms become significantly worse during the night or over the weekend, proceed to the local emergency room.  °

## 2017-01-14 NOTE — ED Provider Notes (Signed)
[facility]    CSN: [id number] Arrival date & time: 01/14/17  1657     History   Chief Complaint Chief Complaint  Patient presents with  . Back Pain    HPI Angela Hinton is a 26 y.o. female.   Patient complains of onset of dull right lower back pain last night, described as being "kicked by a horse."   She notes that the pain has now decreased significantly.  She states that the pain is worse with deep inspiration.  She denies cough.  Her period is now 12 days late.  She had a BTL in August 2017.  She feels well otherwise.  No nausea/vomiting.  No fevers, chills, and sweats.    The history is provided by the patient.  Back Pain  Location:  Lumbar spine Quality:  Aching Radiates to:  Does not radiate Pain severity:  Moderate Pain is:  Same all the time Onset quality:  Sudden Duration:  1 day Timing:  Constant Progression:  Resolved Chronicity:  New Context: not falling, not lifting heavy objects and not recent injury   Relieved by:  None tried Worsened by:  Movement Ineffective treatments:  None tried Associated symptoms: no abdominal pain, no abdominal swelling, no bladder incontinence, no bowel incontinence, no chest pain, no fever, no leg pain, no numbness, no paresthesias, no pelvic pain, no perianal numbness, no tingling and no weakness     Past Medical History:  Diagnosis Date  . Morbid obesity (HCC)   . PCOS (polycystic ovarian syndrome)     There are no active problems to display for this patient.   Past Surgical History:  Procedure Laterality Date  . CESAREAN SECTION      OB History    Gravida Para Term Preterm AB Living   1             SAB TAB Ectopic Multiple Live Births                   Home Medications    Prior to Admission medications   Medication Sig Start Date End Date Taking? Authorizing Provider  aspirin EC 81 MG tablet Take 81 mg by mouth daily.    Historical Provider, MD  Prenatal Vit-Fe Fumarate-FA  (MULTIVITAMIN-PRENATAL) 27-0.8 MG TABS tablet Take 1 tablet by mouth daily at 12 noon.    Historical Provider, MD  pyridOXINE (VITAMIN B-6) 100 MG tablet Take 100 mg by mouth daily.    Historical Provider, MD    Family History Family History  Problem Relation Age of Onset  . Hypertension Father     Social History Social History  Substance Use Topics  . Smoking status: Never Smoker  . Smokeless tobacco: Never Used  . Alcohol use No     Allergies   Morphine and related and Ivp dye [iodinated diagnostic agents]   Review of Systems Review of Systems  Constitutional: Negative for fever.  Cardiovascular: Negative for chest pain.  Gastrointestinal: Negative for abdominal pain and bowel incontinence.  Genitourinary: Negative for bladder incontinence and pelvic pain.  Musculoskeletal: Positive for back pain.  Neurological: Negative for tingling, weakness, numbness and paresthesias.  All other systems reviewed and are negative.    Physical Exam Triage Vital Signs ED Triage Vitals  Enc Vitals Group     BP 01/14/17 1748 118/78     Pulse Rate 01/14/17 1748 82     Resp --      Temp 01/14/17 1748 98.1 F (36.7 C)  Temp Source 01/14/17 1748 Oral     SpO2 01/14/17 1748 100 %     Weight 01/14/17 1748 (!) 310 lb (140.6 kg)     Height --      Head Circumference --      Peak Flow --      Pain Score 01/14/17 1749 2     Pain Loc --      Pain Edu? --      Excl. in GC? --    No data found.   Updated Vital Signs BP 118/78 (BP Location: Right Arm)   Pulse 82   Temp 98.1 F (36.7 C) (Oral)   Wt (!) 310 lb (140.6 kg)   LMP 11/13/2016 (Approximate)   SpO2 100%   Breastfeeding? No   BMI 56.70 kg/m   Visual Acuity Right Eye Distance:   Left Eye Distance:   Bilateral Distance:    Right Eye Near:   Left Eye Near:    Bilateral Near:     Physical Exam  Constitutional: She appears well-developed and well-nourished. No distress.  HENT:  Head: Normocephalic.  Right Ear:  External ear normal.  Left Ear: External ear normal.  Nose: Nose normal.  Mouth/Throat: Oropharynx is clear and moist.  Eyes: Conjunctivae are normal. Pupils are equal, round, and reactive to light.  Neck: Normal range of motion.  Cardiovascular: Normal heart sounds.   Pulmonary/Chest: Effort normal and breath sounds normal.  Abdominal: Soft. There is no tenderness.  Musculoskeletal: She exhibits no edema.       Back:  Vague mild tenderness to palpation right flank as noted on diagram.   Neurological: She is alert. She displays normal reflexes. No cranial nerve deficit.  Skin: Skin is warm and dry. No rash noted.  Nursing note and vitals reviewed.    UC Treatments / Results  Labs (all labs ordered are listed, but only abnormal results are displayed) Labs Reviewed  POCT URINALYSIS DIP (MANUAL ENTRY) - Abnormal; Notable for the following:       Result Value   Clarity, UA cloudy (*)    All other components within normal limits  POCT URINE PREGNANCY nveegati  POCT CBC W AUTO DIFF ([facility]): WBC 8.6; LY 35.7; MO 11.9; GR 52.4; Hgb 10.6; Platelets 322     EKG  EKG Interpretation None       Radiology No results found.  Procedures Procedures (including critical care time)  Medications Ordered in UC Medications - No data to display   Initial Impression / Assessment and Plan / UC Course  I have reviewed the triage vital signs and the nursing notes.  Pertinent labs & imaging results that were available during my care of the patient were reviewed by me and considered in my medical decision making (see chart for details).    Suspect musculoskeletal pain.  Normal WBC, negative UPT and UA reassuring. May take Ibuprofen 200mg , 4 tabs every 8 hours with food.  Call if rash develops. If symptoms become significantly worse during the night or over the weekend, proceed to the local emergency room.  Note presence of persistent anemia; recommend follow-up with PCP for  management.    Final Clinical Impressions(s) / UC Diagnoses   Final diagnoses:  Acute right flank pain    New Prescriptions New Prescriptions   No medications on file     Lattie Haw, MD 01/20/17 2130

## 2017-01-22 ENCOUNTER — Telehealth: Payer: Self-pay | Admitting: *Deleted

## 2017-01-22 LAB — POCT CBC W AUTO DIFF (K'VILLE URGENT CARE)

## 2017-11-18 DIAGNOSIS — R1013 Epigastric pain: Secondary | ICD-10-CM | POA: Diagnosis not present

## 2017-11-18 DIAGNOSIS — R109 Unspecified abdominal pain: Secondary | ICD-10-CM | POA: Diagnosis not present

## 2017-11-18 DIAGNOSIS — R0781 Pleurodynia: Secondary | ICD-10-CM | POA: Diagnosis not present

## 2018-01-09 DIAGNOSIS — J111 Influenza due to unidentified influenza virus with other respiratory manifestations: Secondary | ICD-10-CM | POA: Diagnosis not present

## 2018-04-16 DIAGNOSIS — Z124 Encounter for screening for malignant neoplasm of cervix: Secondary | ICD-10-CM | POA: Diagnosis not present

## 2018-04-16 DIAGNOSIS — Z113 Encounter for screening for infections with a predominantly sexual mode of transmission: Secondary | ICD-10-CM | POA: Diagnosis not present

## 2018-04-16 DIAGNOSIS — Z01419 Encounter for gynecological examination (general) (routine) without abnormal findings: Secondary | ICD-10-CM | POA: Diagnosis not present

## 2018-04-16 DIAGNOSIS — E282 Polycystic ovarian syndrome: Secondary | ICD-10-CM | POA: Diagnosis not present

## 2018-04-16 DIAGNOSIS — N926 Irregular menstruation, unspecified: Secondary | ICD-10-CM | POA: Diagnosis not present

## 2018-09-10 DIAGNOSIS — M79671 Pain in right foot: Secondary | ICD-10-CM | POA: Diagnosis not present

## 2018-09-10 DIAGNOSIS — M21621 Bunionette of right foot: Secondary | ICD-10-CM | POA: Diagnosis not present

## 2018-09-10 DIAGNOSIS — M79672 Pain in left foot: Secondary | ICD-10-CM | POA: Diagnosis not present

## 2018-09-10 DIAGNOSIS — M2041 Other hammer toe(s) (acquired), right foot: Secondary | ICD-10-CM | POA: Diagnosis not present

## 2018-09-10 DIAGNOSIS — M7750 Other enthesopathy of unspecified foot: Secondary | ICD-10-CM | POA: Diagnosis not present

## 2018-09-10 DIAGNOSIS — M869 Osteomyelitis, unspecified: Secondary | ICD-10-CM | POA: Diagnosis not present

## 2018-09-16 DIAGNOSIS — N912 Amenorrhea, unspecified: Secondary | ICD-10-CM | POA: Diagnosis not present

## 2018-09-16 DIAGNOSIS — N6452 Nipple discharge: Secondary | ICD-10-CM | POA: Diagnosis not present

## 2018-12-21 DIAGNOSIS — R5383 Other fatigue: Secondary | ICD-10-CM | POA: Diagnosis not present

## 2019-03-15 DIAGNOSIS — R05 Cough: Secondary | ICD-10-CM | POA: Diagnosis not present

## 2019-04-20 ENCOUNTER — Other Ambulatory Visit: Payer: Self-pay

## 2019-04-20 ENCOUNTER — Ambulatory Visit (INDEPENDENT_AMBULATORY_CARE_PROVIDER_SITE_OTHER): Payer: BLUE CROSS/BLUE SHIELD | Admitting: Family Medicine

## 2019-04-20 ENCOUNTER — Ambulatory Visit (INDEPENDENT_AMBULATORY_CARE_PROVIDER_SITE_OTHER): Payer: BLUE CROSS/BLUE SHIELD

## 2019-04-20 ENCOUNTER — Encounter: Payer: Self-pay | Admitting: Family Medicine

## 2019-04-20 VITALS — BP 131/84 | HR 83 | Ht 62.0 in | Wt 321.0 lb

## 2019-04-20 DIAGNOSIS — S46812A Strain of other muscles, fascia and tendons at shoulder and upper arm level, left arm, initial encounter: Secondary | ICD-10-CM

## 2019-04-20 DIAGNOSIS — M25512 Pain in left shoulder: Secondary | ICD-10-CM

## 2019-04-20 DIAGNOSIS — M542 Cervicalgia: Secondary | ICD-10-CM | POA: Diagnosis not present

## 2019-04-20 DIAGNOSIS — M19012 Primary osteoarthritis, left shoulder: Secondary | ICD-10-CM | POA: Diagnosis not present

## 2019-04-20 MED ORDER — CYCLOBENZAPRINE HCL 10 MG PO TABS: 5.0000 mg | ORAL_TABLET | Freq: Three times a day (TID) | ORAL | 0 refills | Status: DC | PRN

## 2019-04-20 NOTE — Patient Instructions (Signed)
Thank you for coming in today. Attend PT.  Use heating pad and TENS unit.  Use muscle relaxer mostly at bedtime.   Recheck in 4-6 weeks if not all better.    TENS UNIT: This is helpful for muscle pain and spasm.   Search and Purchase a TENS 7000 2nd edition at  www.tenspros.com or www.Amazon.com It should be less than $30.     TENS unit instructions: Do not shower or bathe with the unit on Turn the unit off before removing electrodes or batteries If the electrodes lose stickiness add a drop of water to the electrodes after they are disconnected from the unit and place on plastic sheet. If you continued to have difficulty, call the TENS unit company to purchase more electrodes. Do not apply lotion on the skin area prior to use. Make sure the skin is clean and dry as this will help prolong the life of the electrodes. After use, always check skin for unusual red areas, rash or other skin difficulties. If there are any skin problems, does not apply electrodes to the same area. Never remove the electrodes from the unit by pulling the wires. Do not use the TENS unit or electrodes other than as directed. Do not change electrode placement without consultating your therapist or physician. Keep 2 fingers with between each electrode. Wear time ratio is 2:1, on to off times.    For example on for 30 minutes off for 15 minutes and then on for 30 minutes off for 15 minutes

## 2019-04-20 NOTE — Progress Notes (Signed)
Subjective:    CC: Left shoulder pain  HPI: Angela Hinton is left-hand dominant woman with a few week history of left shoulder pain and left neck pain.  Symptoms have been present for about 2 weeks now.  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  She notes pain in the left lateral neck and left trapezius region.  Pain is worse with activity and better with rest.  She is tried Tylenol rest heating pad and range of motion exercises which have not helped much.  She denies any pain in the lateral upper arm or radiating down the arm.  She denies any weakness distally.  No fevers chills nausea vomiting or diarrhea.   Past medical history, Surgical history, Family history not pertinant except as noted below, Social history, Allergies, and medications have been entered into the medical record, reviewed, and no changes needed.   Review of Systems: No headache, visual changes, nausea, vomiting, diarrhea, constipation, dizziness, abdominal pain, skin rash, fevers, chills, night sweats, weight loss, swollen lymph nodes, body aches, joint swelling, muscle aches, chest pain, shortness of breath, mood changes, visual or auditory hallucinations.   Objective:    Vitals:   04/20/19 0915  BP: 131/84  Pulse: 83  SpO2: 100%   General: Well Developed, well nourished, and in no acute distress.  Neuro/Psych: Alert and oriented x3, extra-ocular muscles intact, able to move all 4 extremities, sensation grossly intact. Skin: Warm and dry, no rashes noted.  Respiratory: Not using accessory muscles, speaking in full sentences, trachea midline.  Cardiovascular: Pulses palpable, no extremity edema. Abdomen: Does not appear distended. MSK: C-spine: Nontender to spinal midline.  Tender palpation left cervical paraspinal musculature and left trapezius. Cervical motion normal some pain with left lateral flexion and left rotation. Upper extremity strength is equal normal throughout. Reflexes equal  normal throughout bilateral upper extremities. Sensation is intact throughout bilateral upper extremities.  Left shoulder: Normal-appearing. Tender palpation left trapezius. Normal motion however some pain with abduction located in the trapezius and periscapular area. Negative impingement testing. Negative empty can test. Normal strength.  Contralateral right shoulder normal-appearing nontender normal motion normal strength.  Lab and Radiology Results  X-ray image left shoulder and C-spine personally independently reviewed  C-spine: Loss of cervical lordosis.  No significant degenerative changes.  No fractures or malalignment.  [facility].  Possible ossification center or early degenerative changes.  No acute fractures.  No other abnormal changes present.  Await formal radiology review   Impression and Recommendations:    Assessment and Plan: 28 y.o. female with left trapezius pain.  Patient likely has trapezius and periscapular spasm and dysfunction.  Plan for home exercise program heating pad TENS unit NSAIDs muscle relaxers and referral to physical therapy.  Recheck if not improving.  [facility]  Likely a nonfactor if she is not very painful in this area.  Await formal radiology over read.  If needed further evaluation MRI may be a good next study.Marland Kitchen.  PDMP not reviewed this encounter. Orders Placed This Encounter  Procedures  . DG Cervical Spine Complete    Standing Status:   Future    Number of Occurrences:   1    Standing Expiration Date:   06/19/2020    Order Specific Question:   Reason for Exam (SYMPTOM  OR DIAGNOSIS REQUIRED)    Answer:   left trap pain    Order Specific Question:  Is patient pregnant?    Answer:   No    Order Specific Question:   Preferred imaging location?    Answer:   [facility]    Order Specific Question:   Radiology Contrast Protocol - do NOT remove  file path    Answer:   \\[email or web address]s.[email or web address]  . DG Shoulder Left    Standing Status:   Future    Number of Occurrences:   1    Standing Expiration Date:   06/19/2020    Order Specific Question:   Reason for Exam (SYMPTOM  OR DIAGNOSIS REQUIRED)    Answer:   left trap pain    Order Specific Question:   Is patient pregnant?    Answer:   No    Order Specific Question:   Preferred imaging location?    Answer:   [facility]    Order Specific Question:   Radiology Contrast Protocol - do NOT remove file path    Answer:   \\[email or web address]s.pdf  . Ambulatory referral to Physical Therapy    Referral Priority:   Routine    Referral Type:   Physical Medicine    Referral Reason:   Specialty Services Required    Requested Specialty:   Physical Therapy   Meds ordered this encounter  Medications  . cyclobenzaprine (FLEXERIL) 10 MG tablet    Sig: Take 0.5-1 tablets (5-10 mg total) by mouth 3 (three) times daily as needed for muscle spasms.    Dispense:  30 tablet    Refill:  0    Discussed warning signs or symptoms. Please see discharge instructions. Patient expresses understanding.

## 2019-04-28 ENCOUNTER — Ambulatory Visit: Payer: BLUE CROSS/BLUE SHIELD | Admitting: Rehabilitative and Restorative Service Providers"

## 2019-05-18 DIAGNOSIS — Z03818 Encounter for observation for suspected exposure to other biological agents ruled out: Secondary | ICD-10-CM | POA: Diagnosis not present

## 2019-07-03 DIAGNOSIS — Z20828 Contact with and (suspected) exposure to other viral communicable diseases: Secondary | ICD-10-CM | POA: Diagnosis not present

## 2019-09-06 ENCOUNTER — Ambulatory Visit: Payer: BLUE CROSS/BLUE SHIELD | Admitting: Osteopathic Medicine

## 2020-03-09 ENCOUNTER — Other Ambulatory Visit: Payer: Self-pay

## 2020-03-09 ENCOUNTER — Encounter: Payer: Self-pay | Admitting: Medical-Surgical

## 2020-03-09 ENCOUNTER — Ambulatory Visit (INDEPENDENT_AMBULATORY_CARE_PROVIDER_SITE_OTHER): Payer: BC Managed Care – PPO | Admitting: Medical-Surgical

## 2020-03-09 VITALS — BP 120/80 | HR 72 | Temp 97.7°F | Ht 62.0 in | Wt 335.0 lb

## 2020-03-09 DIAGNOSIS — K219 Gastro-esophageal reflux disease without esophagitis: Secondary | ICD-10-CM | POA: Diagnosis not present

## 2020-03-09 DIAGNOSIS — Z0000 Encounter for general adult medical examination without abnormal findings: Secondary | ICD-10-CM | POA: Diagnosis not present

## 2020-03-09 DIAGNOSIS — D509 Iron deficiency anemia, unspecified: Secondary | ICD-10-CM

## 2020-03-09 DIAGNOSIS — E282 Polycystic ovarian syndrome: Secondary | ICD-10-CM | POA: Diagnosis not present

## 2020-03-09 MED ORDER — PANTOPRAZOLE SODIUM 40 MG PO TBEC: 40.0000 mg | DELAYED_RELEASE_TABLET | Freq: Every day | ORAL | 1 refills | Status: AC

## 2020-03-09 NOTE — Progress Notes (Signed)
New Patient Office Visit  Subjective:  Patient ID: Angela Hinton, female    DOB: 03-14-1991  Age: 29 y.o. MRN: [id number]  CC: No chief complaint on file.   HPI Angela Hinton presents to establish care and discuss GERD, PCOS, and weight concerns.  GERD-reports severe heartburn symptoms including burning in the chest, jaw pain, chronic cough, and epigastric pain.  Does not currently take any acid suppression.  Symptoms exacerbated with intake of greasy or spicy foods.  PCOS-reports long history of struggling with symptoms of PCOS.  Menarche in the first grade followed by approximately 100 pounds of weight gain within the next year.  Continues to have difficulty managing weight.  Irregular menses with cycles occurring every 2 to 3 months.  Significant hirsutism with pseudofolliculitis barbae along the chin.  Previously treated with Metformin which she felt regulated her cycles and helped with symptoms some.  Tolerated well with mild nausea but has since stopped treatment.  Also previously treated with oral contraceptives which also helped.  Currently on no contraception, tubal ligation in 2017.  Weight concerns-has always been heavy but reports she is at her heaviest now, similar to when she was 9 months pregnant.  Is very concerned and can tell a difference in her body and her overall wellbeing.  When she was being treated with Metformin and COC's for PCOS, reports she did lose a little weight.  Has a history of being very active, doing cardio, CrossFit, etc. but still did not see much weight loss.  Endorses being a comfort eater and at times says that she will eat without realizing what she is doing or being hungry.  Would like to explore weight loss opportunities and options.  Past Medical History:  Diagnosis Date  . History of severe pre-eclampsia 01/25/2016   Overview:  Base-line UPC = 0.061 (03/22/16).  UPC (05/15/16): 0.081, UPC (06/13/16):  0.102.  Recommended she take a baby Aspirin  daily  Last Assessment & Plan:  Seen yesterday, UPC was 0.1, HELLP labs were normal I have reviewed with her S/S of pre-eclampsia Mostly has BP 130-140/80-95 at home Will get growth scan with BPP next week. Will get BPP on week 31 and then start weekly NST's at 32 weeks. W  . Morbid obesity (HCC)   . PCOS (polycystic ovarian syndrome)   . S/P tubal ligation 07/20/2016    Past Surgical History:  Procedure Laterality Date  . CESAREAN SECTION    . TUBAL LIGATION      Family History  Problem Relation Age of Onset  . Hypertension Father   . Diabetes Father   . Hypertension Mother   . Diabetes Mother     Social History   Socioeconomic History  . Marital status: Married    Spouse name: Not on file  . Number of children: Not on file  . Years of education: Not on file  . Highest education level: Not on file  Occupational History  . Not on file  Tobacco Use  . Smoking status: Never Smoker  . Smokeless tobacco: Never Used  Substance and Sexual Activity  . Alcohol use: No  . Drug use: No  . Sexual activity: Not on file  Other Topics Concern  . Not on file  Social History Narrative  . Not on file   Social Determinants of Health   Financial Resource Strain:   . Difficulty of Paying Living Expenses:   Food Insecurity:   . Worried About Programme researcher, broadcasting/film/video in  the Last Year:   . Ran Out of Food in the Last Year:   Transportation Needs:   . Freight forwarder (Medical):   Marland Kitchen Lack of Transportation (Non-Medical):   Physical Activity:   . Days of Exercise per Week:   . Minutes of Exercise per Session:   Stress:   . Feeling of Stress :   Social Connections:   . Frequency of Communication with Friends and Family:   . Frequency of Social Gatherings with Friends and Family:   . Attends Religious Services:   . Active Member of Clubs or Organizations:   . Attends Banker Meetings:   Marland Kitchen Marital Status:   Intimate Partner Violence:   . Fear of Current or Ex-Partner:    . Emotionally Abused:   Marland Kitchen Physically Abused:   . Sexually Abused:     ROS Review of Systems  Constitutional: Positive for unexpected weight change. Negative for chills, fatigue and fever.  Respiratory: Negative for cough, chest tightness and shortness of breath.   Cardiovascular: Negative for chest pain, palpitations and leg swelling.  Gastrointestinal: Negative for abdominal pain, constipation, diarrhea, nausea and vomiting.  Neurological: Negative for light-headedness and headaches.  Psychiatric/Behavioral: Negative for self-injury, sleep disturbance and suicidal ideas. The patient is not nervous/anxious.     Objective:   Today's Vitals: BP 120/80   Pulse 72   Temp 97.7 F (36.5 C) (Oral)   Ht 5\' 2"  (1.575 m)   Wt (!) 335 lb (152 kg)   LMP 03/02/2020   BMI 61.27 kg/m   Physical Exam Vitals reviewed.  Constitutional:      General: She is not in acute distress.    Appearance: Normal appearance.  HENT:     Head: Normocephalic and atraumatic.  Cardiovascular:     Rate and Rhythm: Normal rate and regular rhythm.     Pulses: Normal pulses.     Heart sounds: Normal heart sounds. No murmur. No friction rub. No gallop.   Pulmonary:     Effort: Pulmonary effort is normal. No respiratory distress.     Breath sounds: Normal breath sounds. No wheezing.  Skin:    General: Skin is warm and dry.  Neurological:     Mental Status: She is alert and oriented to person, place, and time.  Psychiatric:        Mood and Affect: Mood normal.        Behavior: Behavior normal.        Thought Content: Thought content normal.        Judgment: Judgment normal.     Assessment & Plan:   1. PCOS (polycystic ovarian syndrome) Discussed treatment options for PCOS.  Patient would like to think about the options and will let me know if she decides she would like to start medications.  2. Morbid obesity (HCC) Referring to bariatric surgery for weight management. - Amb Referral to Bariatric  Surgery  3. Gastroesophageal reflux disease without esophagitis Discussed severity of GERD and potential for damage to the stomach lining and esophagus if this goes unchecked.  Starting Protonix 40 mg daily for 8 weeks to see if this helps with symptoms. - pantoprazole (PROTONIX) 40 MG tablet; Take 1 tablet (40 mg total) by mouth daily.  Dispense: 30 tablet; Refill: 1  4. Preventative health care No preventative lab work for at least a couple of years.  Checking CBC, CMP, lipid panel today. - CBC - COMPLETE METABOLIC PANEL WITH GFR - Lipid panel  Outpatient Encounter Medications as of 03/09/2020  Medication Sig  . BIOTIN FORTE PO Take 1 tablet by mouth daily.  . ferrous sulfate 325 (65 FE) MG tablet Take by mouth.  . Multiple Vitamin (MULTIVITAMIN ADULT PO) Take 1 tablet by mouth daily.  . pantoprazole (PROTONIX) 40 MG tablet Take 1 tablet (40 mg total) by mouth daily.  . [DISCONTINUED] cyclobenzaprine (FLEXERIL) 10 MG tablet Take 0.5-1 tablets (5-10 mg total) by mouth 3 (three) times daily as needed for muscle spasms.  . [DISCONTINUED] fluticasone (FLONASE) 50 MCG/ACT nasal spray Place into the nose.  . [DISCONTINUED] montelukast (SINGULAIR) 10 MG tablet Take by mouth.  . [DISCONTINUED] norethindrone-ethinyl estradiol (LOESTRIN FE) 1-20 MG-MCG tablet Take by mouth.  . [DISCONTINUED] oseltamivir (TAMIFLU) 75 MG capsule Take by mouth.  . [DISCONTINUED] Prenatal Vit-Fe Fumarate-FA (PNV PRENATAL PLUS MULTIVITAMIN) 27-1 MG TABS Take by mouth.  . [DISCONTINUED] pyridoxine (B-6) 100 MG tablet Take by mouth.   No facility-administered encounter medications on file as of 03/09/2020.    Follow-up: Return in about 1 year (around 03/09/2021) for Annual physical exam.   Clearnce Sorrel, DNP, APRN, FNP-BC [facility]

## 2020-03-11 LAB — CBC
HCT: 42.2 % (ref 35.0–45.0)
Hemoglobin: 11.9 g/dL (ref 11.7–15.5)
MCH: 19.4 pg — ABNORMAL LOW (ref 27.0–33.0)
MCHC: 28.2 g/dL — ABNORMAL LOW (ref 32.0–36.0)
MCV: 68.8 fL — ABNORMAL LOW (ref 80.0–100.0)
MPV: 11.3 fL (ref 7.5–12.5)
Platelets: 261 10*3/uL (ref 140–400)
RBC: 6.13 10*6/uL — ABNORMAL HIGH (ref 3.80–5.10)
RDW: 17.6 % — ABNORMAL HIGH (ref 11.0–15.0)
WBC: 7.6 10*3/uL (ref 3.8–10.8)

## 2020-03-11 LAB — COMPLETE METABOLIC PANEL WITH GFR
AG Ratio: 1.1 (calc) (ref 1.0–2.5)
ALT: 10 U/L (ref 6–29)
AST: 20 U/L (ref 10–30)
Albumin: 4.1 g/dL (ref 3.6–5.1)
Alkaline phosphatase (APISO): 75 U/L (ref 31–125)
BUN: 15 mg/dL (ref 7–25)
CO2: 27 mmol/L (ref 20–32)
Calcium: 9.6 mg/dL (ref 8.6–10.2)
Chloride: 103 mmol/L (ref 98–110)
Creat: 0.79 mg/dL (ref 0.50–1.10)
GFR, Est African American: 116 mL/min/{1.73_m2} (ref >=60)
GFR, Est Non African American: 100 mL/min/{1.73_m2} (ref >=60)
Globulin: 3.8 g/dL (calc) — ABNORMAL HIGH (ref 1.9–3.7)
Glucose, Bld: 99 mg/dL (ref 65–99)
Potassium: 4.7 mmol/L (ref 3.5–5.3)
Sodium: 137 mmol/L (ref 135–146)
Total Bilirubin: 0.2 mg/dL (ref 0.2–1.2)
Total Protein: 7.9 g/dL (ref 6.1–8.1)

## 2020-03-11 LAB — IRON,TIBC AND FERRITIN PANEL
%SAT: 11 % (calc) — ABNORMAL LOW (ref 16–45)
Ferritin: 38 ng/mL (ref 16–154)
Iron: 37 ug/dL — ABNORMAL LOW (ref 40–190)
TIBC: 323 mcg/dL (calc) (ref 250–450)

## 2020-03-11 LAB — TEST AUTHORIZATION

## 2020-03-11 LAB — LIPID PANEL
Cholesterol: 141 mg/dL (ref <200)
HDL: 42 mg/dL — ABNORMAL LOW (ref >=50)
LDL Cholesterol (Calc): 84 mg/dL (calc)
Non-HDL Cholesterol (Calc): 99 mg/dL (calc) (ref <130)
Total CHOL/HDL Ratio: 3.4 (calc) (ref <5.0)
Triglycerides: 71 mg/dL (ref <150)

## 2020-03-13 NOTE — Addendum Note (Signed)
Addended byChristen Butter on: 03/13/2020 03:51 PM   Modules accepted: Orders

## 2020-03-22 LAB — HM PAP SMEAR: HM Pap smear: NEGATIVE

## 2020-04-24 ENCOUNTER — Encounter: Payer: Self-pay | Admitting: Medical-Surgical

## 2020-07-23 IMAGING — DX LEFT SHOULDER - 2+ VIEW
3 series · 3 of 3 positions shown · non-contrast
Comparison: None.

CLINICAL DATA: Left shoulder pain for several weeks, no known
injury, initial encounter

EXAM:
LEFT SHOULDER - 2+ VIEW

[shoulder grashey]
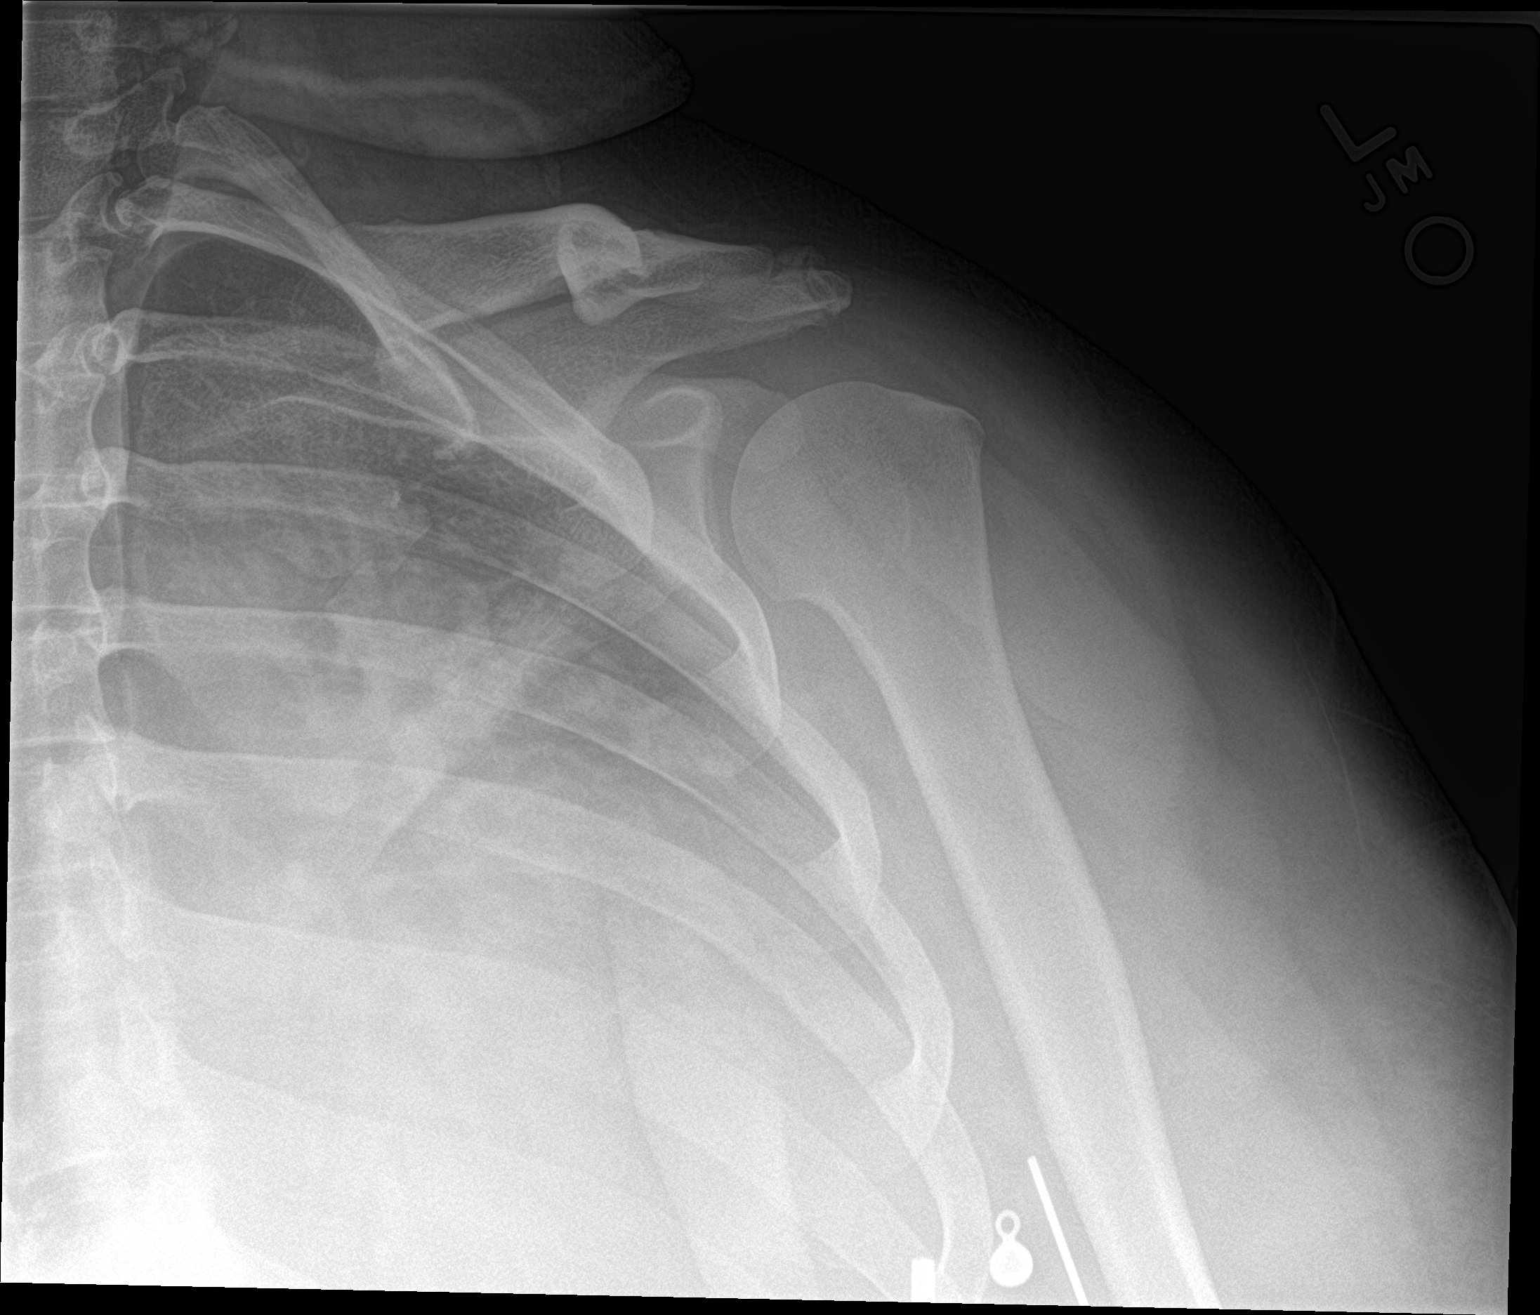

[shoulder y view]
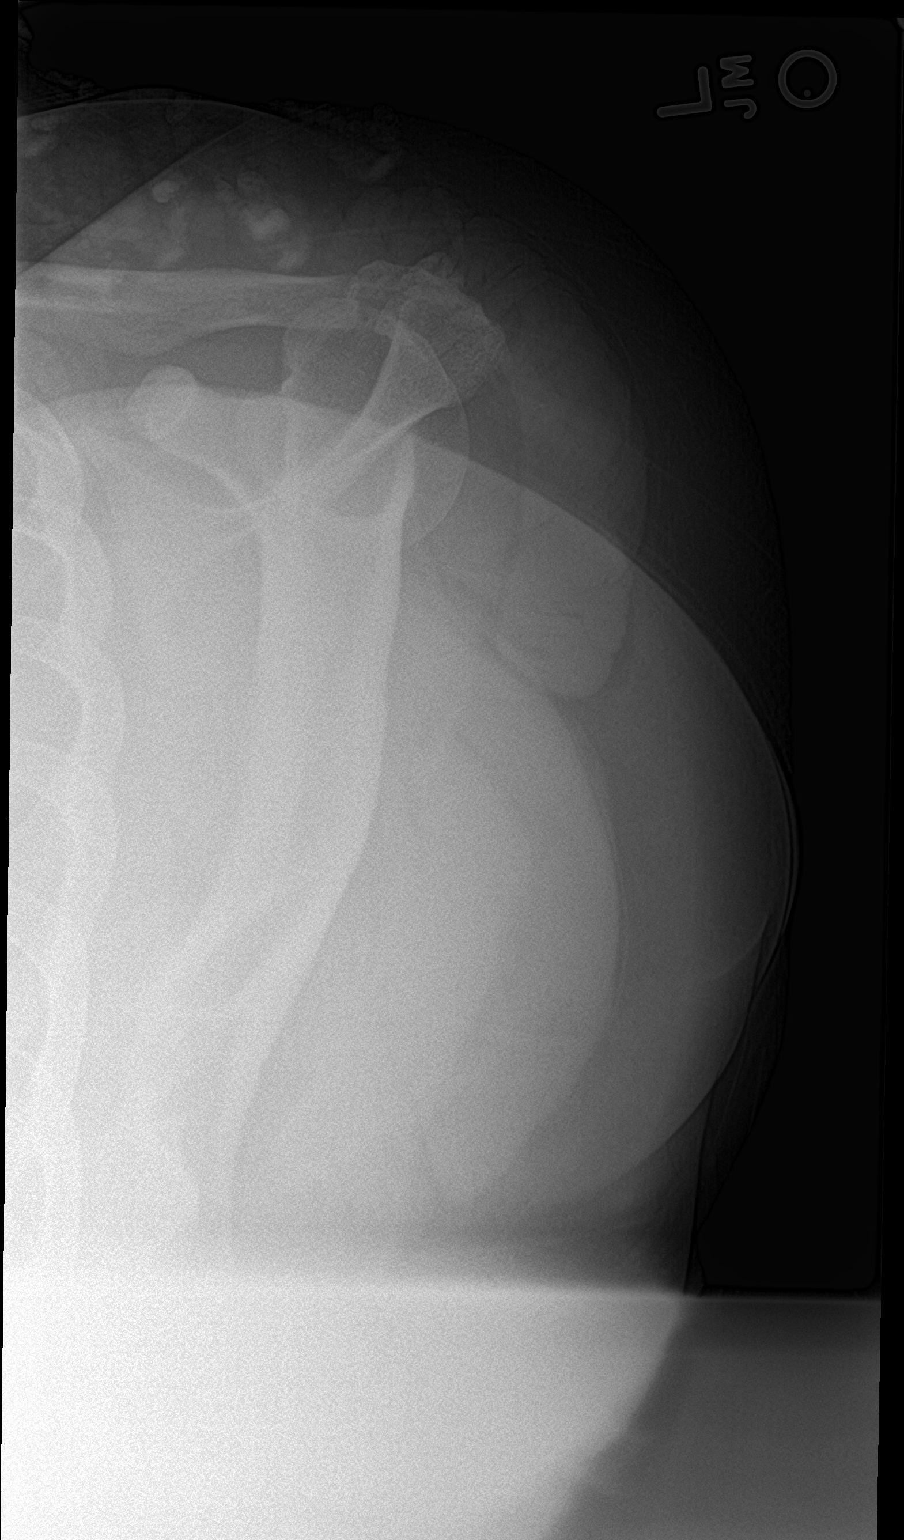

[shoulder axillary]
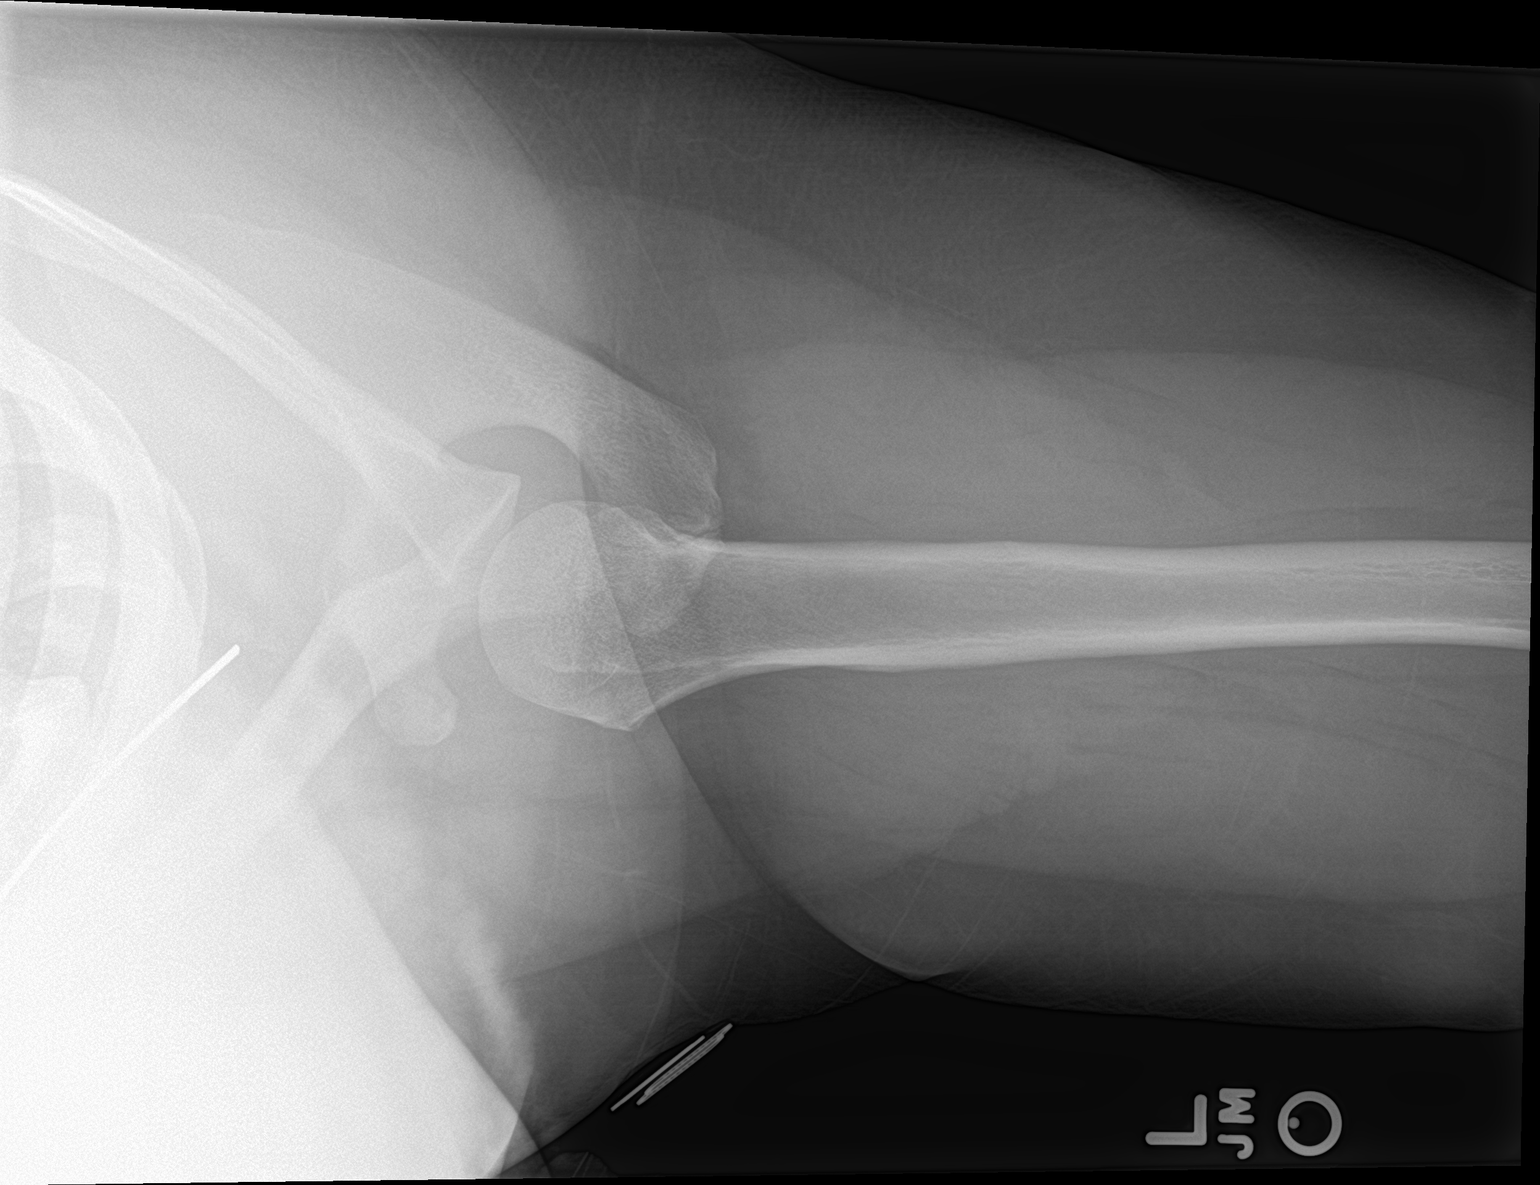

[3 of 3 positions shown; findings below may reference images not displayed]

FINDINGS: Mild degenerative changes of the acromioclavicular joint are seen.
No acute fracture or dislocation is noted. No soft tissue changes
are seen.
IMPRESSION: Mild degenerative change without acute abnormality.
